# Patient Record
Sex: Female | Born: 2000 | Race: Black or African American | Hispanic: No | Marital: Single | Smoking: Never smoker
Health system: Southern US, Community
[De-identification: ages and names within clinical notes are randomized; demographics above are authoritative.]

---
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
## 2019-05-26 ENCOUNTER — Ambulatory Visit: Payer: BC Managed Care – PPO | Attending: Family

## 2019-05-26 DIAGNOSIS — Z23 Encounter for immunization: Secondary | ICD-10-CM

## 2019-05-26 NOTE — Progress Notes (Signed)
   Covid-19 Vaccination Clinic  Name:  Mary Anthony    MRN: [id number] DOB: 12-04-2000  05/26/2019  [name] was observed post Covid-19 immunization for 15 minutes without incident. She was provided with Vaccine Information Sheet and instruction to access the V-Safe system.   [name] was instructed to call 911 with any severe reactions post vaccine: Marland Kitchen Difficulty breathing  . Swelling of face and throat  . A fast heartbeat  . A bad rash all over body  . Dizziness and weakness   Immunizations Administered    Name Date Dose VIS Date Route   Moderna COVID-19 Vaccine 05/26/2019  2:25 PM 0.5 mL 02/15/2019 Intramuscular   Manufacturer: Moderna   Lot: [id number]   NDC: 34373-578-97

## 2019-06-28 ENCOUNTER — Ambulatory Visit: Payer: BC Managed Care – PPO | Attending: Family

## 2019-06-28 DIAGNOSIS — Z23 Encounter for immunization: Secondary | ICD-10-CM

## 2019-06-28 NOTE — Progress Notes (Signed)
   Covid-19 Vaccination Clinic  Name:  Mary Anthony    MRN: [id number] DOB: 2000-06-05  06/28/2019  [name] was observed post Covid-19 immunization for 15 minutes without incident. She was provided with Vaccine Information Sheet and instruction to access the V-Safe system.   [name] was instructed to call 911 with any severe reactions post vaccine: Marland Kitchen Difficulty breathing  . Swelling of face and throat  . A fast heartbeat  . A bad rash all over body  . Dizziness and weakness   Immunizations Administered    Name Date Dose VIS Date Route   Moderna COVID-19 Vaccine 06/28/2019  2:34 PM 0.5 mL 02/15/2019 Intramuscular   Manufacturer: Moderna   Lot: [id number]   NDC: 59276-394-32

## 2019-08-29 ENCOUNTER — Emergency Department (HOSPITAL_COMMUNITY)
Admission: EM | Admit: 2019-08-29 | Discharge: 2019-08-29 | Disposition: A | Discharge status: Home / Self Care | Payer: BC Managed Care – PPO | Attending: Emergency Medicine | Admitting: Emergency Medicine

## 2019-08-29 ENCOUNTER — Encounter (HOSPITAL_COMMUNITY): Payer: Self-pay | Admitting: Emergency Medicine

## 2019-08-29 ENCOUNTER — Emergency Department (HOSPITAL_COMMUNITY): Payer: BC Managed Care – PPO

## 2019-08-29 ENCOUNTER — Other Ambulatory Visit: Payer: Self-pay

## 2019-08-29 DIAGNOSIS — Z20822 Contact with and (suspected) exposure to covid-19: Secondary | ICD-10-CM | POA: Diagnosis not present

## 2019-08-29 DIAGNOSIS — Z79899 Other long term (current) drug therapy: Secondary | ICD-10-CM | POA: Diagnosis not present

## 2019-08-29 DIAGNOSIS — R509 Fever, unspecified: Secondary | ICD-10-CM | POA: Insufficient documentation

## 2019-08-29 LAB — GROUP A STREP BY PCR: Group A Strep by PCR: NOT DETECTED

## 2019-08-29 LAB — SARS CORONAVIRUS 2 (TAT 6-24 HRS): SARS Coronavirus 2: NEGATIVE

## 2019-08-29 MED ORDER — ACETAMINOPHEN 325 MG PO TABS
650.0000 mg | ORAL_TABLET | Freq: Once | ORAL | Status: AC | PRN
  Administered 2019-08-29: 650 mg via ORAL
  Filled 2019-08-29: qty 2

## 2019-08-29 NOTE — Discharge Instructions (Addendum)
You have been treated for a febrile illness.  I want you to take Tylenol every 6 hours for fever control.  Please follow dosaging instructions found on the bottle.  I want you to stay hydrated, drink lots of water as this will help.  You have been tested for COVID-19 please quarantine yourself until your results are available on MyChart within the next 24 hours.  If you are positive you need to quarantine for an additional 14 days and Follow-up with your primary care doctor for reevaluation.  I want you to follow-up with your primary care doctor for reevaluation in 1 week if you are not getting better  I want you to come back to emergency department if you develop uncontrolled nausea, vomiting, diarrhea, severe abdominal pain, chest pain, shortness of breath as the symptoms require further evaluation.

## 2019-08-29 NOTE — ED Notes (Signed)
Pt. Finished eating a meal from [employer].

## 2019-08-29 NOTE — ED Triage Notes (Signed)
Pt c/o fever, chills and headache that started last night. Requesting COVID test.

## 2019-08-29 NOTE — ED Provider Notes (Signed)
[facility] EMERGENCY DEPARTMENT Provider Note   CSN: [id number] Arrival date & time: 08/29/19  1430     History Chief Complaint  Patient presents with  . Fever    Mary Anthony is a 19 y.o. female.  HPI   Patient presents to the emergency department with chief complaint of an elevated fever of 104.  Patient states the fever started yesterday and she admits that she has had a scratchy throat. Patient admits to fever, chills and sore throat  that her friend recently had pneumonia but denies any other sick contacts.  She has had her Covid vaccine.  Has no significant medical history, patient takes oral birth control daily, she denies smoking drinking or doing drugs.  Patient denies nasal congestion, ear pain, postnasal drip, chest pain, shortness of breath, abdominal pain, nausea, vomiting, diarrhea, and urinary symptoms.  History reviewed. No pertinent past medical history.  Patient Active Problem List   Diagnosis Date Noted  . 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 No pertinent surgical history.   OB History   No obstetric history on file.     No family history on file.  Social History   Tobacco Use  . Smoking status: Never Smoker  Substance Use Topics  . Alcohol use: Not on file  . Drug use: Not on file    Home Medications Prior to Admission medications   Medication Sig Start Date End Date Taking? Authorizing Provider  ibuprofen (ADVIL,MOTRIN) 200 MG tablet Take 200 mg by mouth every 6 (six) hours as needed.    [provider]  omeprazole (PRILOSEC) 20 MG capsule Take 1 capsule (20 mg total) by mouth daily. 06/18/14   Piepenbrink, Victorino Dike, PA-C    Allergies    Patient has no known allergies.  Review of Systems   Review of Systems  Constitutional: Positive for chills and fever. Negative for fatigue.  HENT: Positive for sore throat. Negative for congestion, ear discharge, ear pain, postnasal drip, rhinorrhea,  sinus pressure, sinus pain, sneezing and trouble swallowing.   Eyes: Negative for visual disturbance.  Respiratory: Positive for cough. Negative for shortness of breath.   Cardiovascular: Negative for chest pain.  Gastrointestinal: Negative for abdominal pain, diarrhea, nausea and vomiting.  Genitourinary: Negative for dysuria, enuresis and flank pain.  Musculoskeletal: Negative for back pain and joint swelling.  Skin: Negative for rash.  Neurological: Negative for dizziness, light-headedness and headaches.  Hematological: Does not bruise/bleed easily.    Physical Exam Updated Vital Signs BP 110/64   Pulse 86   Temp 98.9 F (37.2 C) (Oral)   Resp 18   Ht 5\' 4"  (1.626 m)   Wt 68 kg   SpO2 100%   BMI 25.75 kg/m   Physical Exam Vitals and nursing note reviewed.  Constitutional:      General: She is not in acute distress.    Appearance: She is not ill-appearing.  HENT:     Head: Normocephalic and atraumatic.     Nose: No congestion.     Mouth/Throat:     Lips: No lesions.     Mouth: Mucous membranes are moist.     Tongue: Tongue does not deviate from midline.     Palate: No lesions.     Pharynx: Oropharynx is clear. Uvula midline. No pharyngeal swelling, oropharyngeal exudate, posterior oropharyngeal erythema or uvula swelling.     Tonsils: No tonsillar exudate or tonsillar abscesses.  Eyes:     General: No scleral icterus.  Cardiovascular:     Rate and Rhythm: Normal rate and regular rhythm.     Pulses: Normal pulses.     Heart sounds: No murmur heard.  No friction rub. No gallop.   Pulmonary:     Effort: No respiratory distress.     Breath sounds: No wheezing, rhonchi or rales.  Abdominal:     General: There is no distension.     Palpations: Abdomen is soft.     Tenderness: There is no abdominal tenderness. There is no guarding.  Musculoskeletal:        General: No swelling or tenderness.     Cervical back: Neck supple. No rigidity or tenderness.  Skin:     General: Skin is warm and dry.     Capillary Refill: Capillary refill takes less than 2 seconds.     Findings: No rash.  Neurological:     Mental Status: She is alert and oriented to person, place, and time.  Psychiatric:        Mood and Affect: Mood normal.     ED Results / Procedures / Treatments   Labs (all labs ordered are listed, but only abnormal results are displayed) Labs Reviewed  GROUP A STREP BY PCR  SARS CORONAVIRUS 2 (TAT 6-24 HRS)    EKG None  Radiology DG Chest Port 1 View  Result Date: 08/29/2019 CLINICAL DATA:  Complains of headache, chest pain and fevers. EXAM: PORTABLE CHEST 1 VIEW COMPARISON:  06/18/2014 FINDINGS: The heart size and mediastinal contours are within normal limits. Both lungs are clear. The visualized skeletal structures are unremarkable. IMPRESSION: No active disease. Electronically Signed   By: Signa Kell M.D.   On: 08/29/2019 18:09    Procedures Procedures (including critical care time)  Medications Ordered in ED Medications  acetaminophen (TYLENOL) tablet 650 mg (650 mg Oral Given 08/29/19 1527)    ED Course  I have reviewed the triage vital signs and the nursing notes.  Pertinent labs & imaging results that were available during my care of the patient were reviewed by me and considered in my medical decision making (see chart for details).    MDM Rules/Calculators/A&P                          I have personally reviewed all imaging, labs and have interpreted them.  Due to patient's presentation and physical exam I am most concerned for strep throat, Covid, pneumonia.  Patient was given Tylenol and will get a Covid test, chest x-ray, and strep test.  Labs work was not indicated at this time as patient is denying any nausea, vomiting, she is able to eat and drink without difficulty.  Patient's chest x-ray did not show any acute findings, there is no edema, consolidations, infiltrates, or widened mediastinum, lungs were clear  bilaterally making pneumonia unlikely.  Patient's strep test was negative, physical exam did not show any exudates or erythematous on her tonsils, uvula was midline as well as tongue was having difficulty swallowing or talking, making strep and peritonsillar abscess unlikely.  Patient is resting comfortably in a chair, she is not having any difficulty breathing, vitals have improved she is afebrile after getting Tylenol and nontachycardic.  She does not meet criteria to be emergently admitted to the hospital.  Likely that the patient's symptoms are result of a viral upper respiratory infection.  Patient Covid test is pending she was instructed to quarantine until her results are available. if positive  she will quarantine for 14 days.  Patient was given at home care as well as strict return precautions.  Patient was explained the results and plan, patient stated that she understood and agrees with above plan.   Final Clinical Impression(s) / ED Diagnoses Final diagnoses:  Febrile illness    Rx / DC Orders ED Discharge Orders    None       Marcello Fennel, PA-C 08/29/19 2032    Lennice Sites, DO 08/30/19 0024

## 2019-10-05 ENCOUNTER — Other Ambulatory Visit: Payer: Self-pay | Admitting: Obstetrics and Gynecology

## 2019-10-05 DIAGNOSIS — N6310 Unspecified lump in the right breast, unspecified quadrant: Secondary | ICD-10-CM

## 2019-10-18 ENCOUNTER — Other Ambulatory Visit: Payer: BC Managed Care – PPO

## 2021-05-21 IMAGING — DX DG CHEST 1V PORT
1 series · 1 of 1 positions shown · non-contrast
Comparison: 06/18/2014

CLINICAL DATA: Complains of headache, chest pain and fevers.

EXAM:
PORTABLE CHEST 1 VIEW

[chest ap]
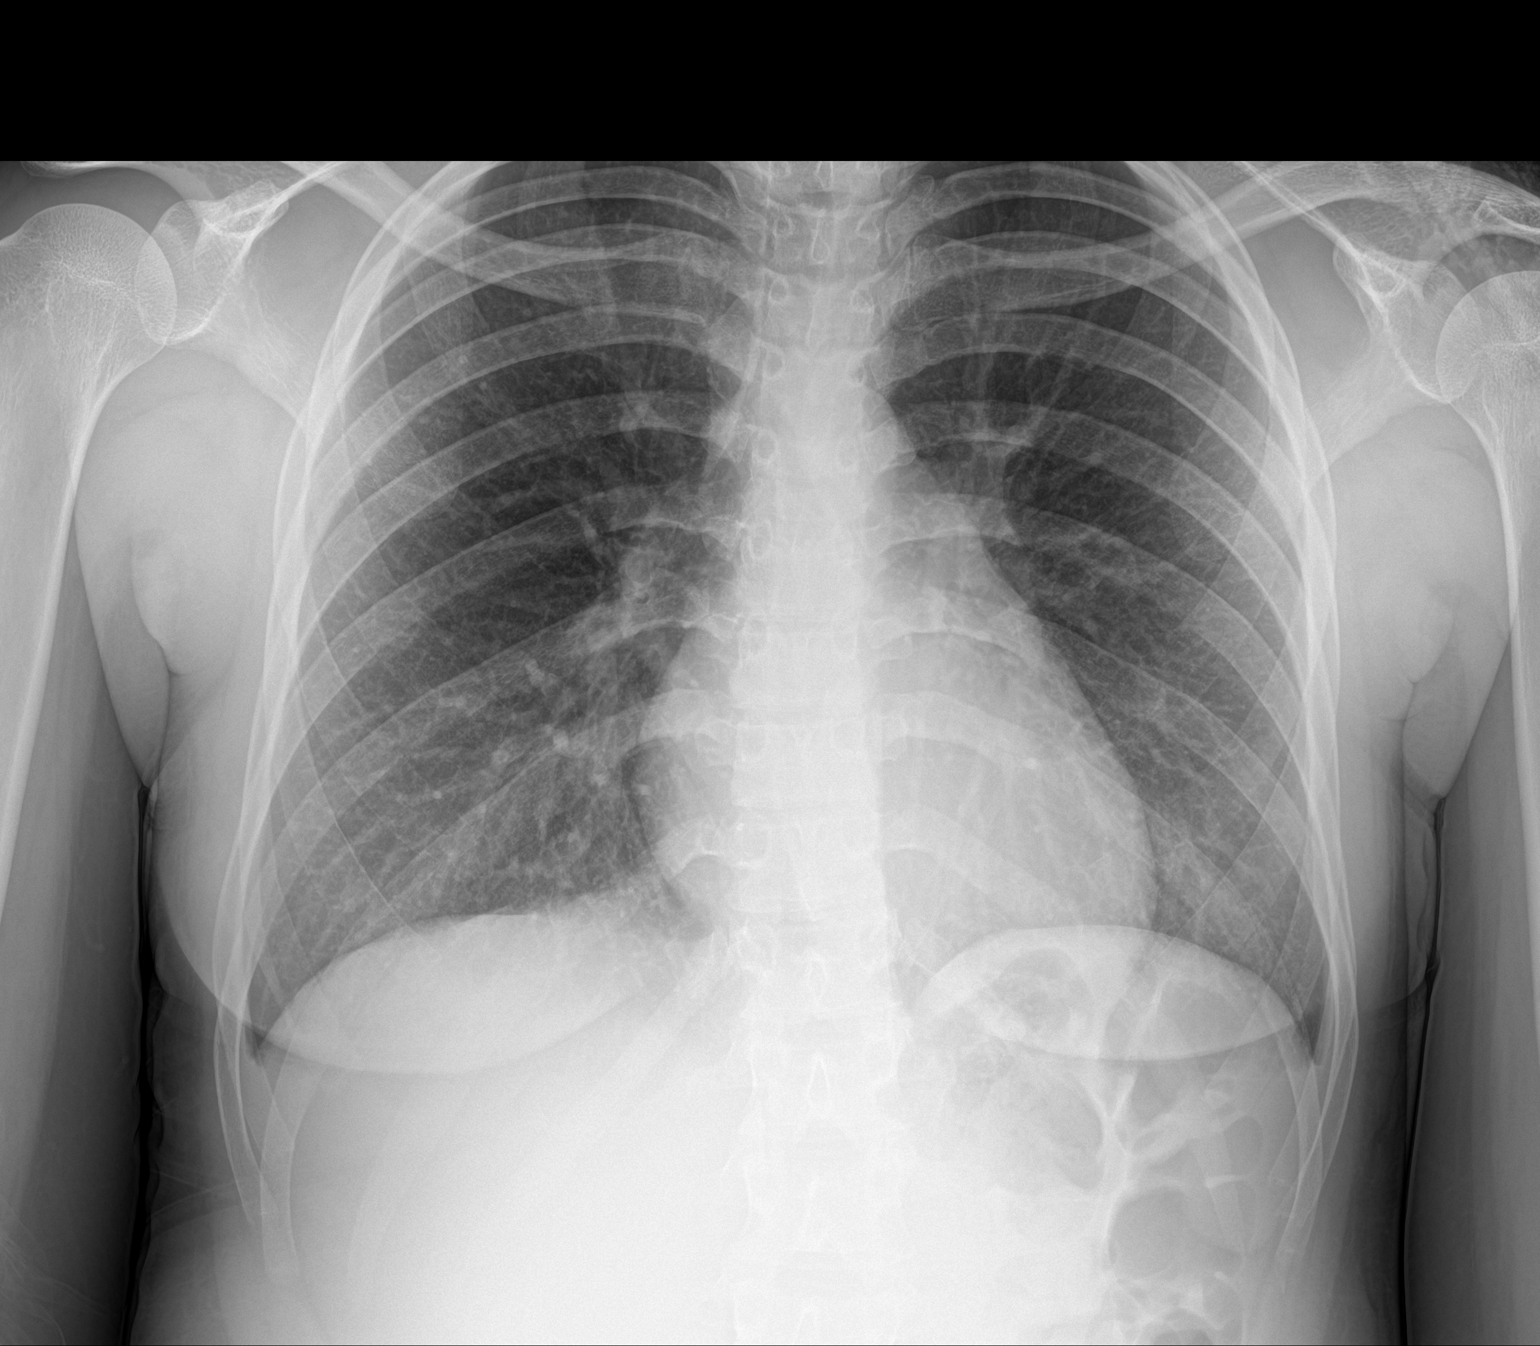

[1 of 1 positions shown; findings below may reference images not displayed]

FINDINGS: The heart size and mediastinal contours are within normal limits.
Both lungs are clear. The visualized skeletal structures are
unremarkable.
IMPRESSION: No active disease.

## 2024-04-08 ENCOUNTER — Other Ambulatory Visit: Payer: Self-pay | Admitting: Family Medicine

## 2024-04-08 DIAGNOSIS — N6320 Unspecified lump in the left breast, unspecified quadrant: Secondary | ICD-10-CM

## 2024-04-08 DIAGNOSIS — N6310 Unspecified lump in the right breast, unspecified quadrant: Secondary | ICD-10-CM

## 2024-04-29 ENCOUNTER — Other Ambulatory Visit
